# Patient Record
Sex: Male | Born: 1990 | Race: White | Hispanic: No | Marital: Single | Smoking: Never smoker
Health system: Southern US, Community
[De-identification: ages and names within clinical notes are randomized; demographics above are authoritative.]

---
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
## 2012-01-03 ENCOUNTER — Emergency Department (HOSPITAL_COMMUNITY)
Admission: EM | Admit: 2012-01-03 | Discharge: 2012-01-03 | Disposition: A | Discharge status: Home / Self Care | Payer: Self-pay | Attending: Emergency Medicine | Admitting: Emergency Medicine

## 2012-01-03 ENCOUNTER — Encounter (HOSPITAL_COMMUNITY): Payer: Self-pay | Admitting: *Deleted

## 2012-01-03 DIAGNOSIS — R1110 Vomiting, unspecified: Secondary | ICD-10-CM | POA: Insufficient documentation

## 2012-01-03 DIAGNOSIS — R109 Unspecified abdominal pain: Secondary | ICD-10-CM | POA: Insufficient documentation

## 2012-01-03 DIAGNOSIS — K529 Noninfective gastroenteritis and colitis, unspecified: Secondary | ICD-10-CM

## 2012-01-03 DIAGNOSIS — K5289 Other specified noninfective gastroenteritis and colitis: Secondary | ICD-10-CM | POA: Insufficient documentation

## 2012-01-03 DIAGNOSIS — R197 Diarrhea, unspecified: Secondary | ICD-10-CM | POA: Insufficient documentation

## 2012-01-03 DIAGNOSIS — R6883 Chills (without fever): Secondary | ICD-10-CM | POA: Insufficient documentation

## 2012-01-03 DIAGNOSIS — R51 Headache: Secondary | ICD-10-CM | POA: Insufficient documentation

## 2012-01-03 LAB — URINALYSIS, ROUTINE W REFLEX MICROSCOPIC
Bilirubin Urine: NEGATIVE
Glucose, UA: NEGATIVE mg/dL
Hgb urine dipstick: NEGATIVE
Ketones, ur: 15 mg/dL — AB
Protein, ur: NEGATIVE mg/dL

## 2012-01-03 LAB — DIFFERENTIAL
Eosinophils Relative: 0 % (ref 0–5)
Lymphocytes Relative: 4 % — ABNORMAL LOW (ref 12–46)
Lymphs Abs: 0.8 10*3/uL (ref 0.7–4.0)
Monocytes Absolute: 2.1 10*3/uL — ABNORMAL HIGH (ref 0.1–1.0)

## 2012-01-03 LAB — BASIC METABOLIC PANEL
CO2: 22 mEq/L (ref 19–32)
Calcium: 10.9 mg/dL — ABNORMAL HIGH (ref 8.4–10.5)
Creatinine, Ser: 1.11 mg/dL (ref 0.50–1.35)
Glucose, Bld: 133 mg/dL — ABNORMAL HIGH (ref 70–99)

## 2012-01-03 LAB — CBC
HCT: 49.9 % (ref 39.0–52.0)
MCH: 31.8 pg (ref 26.0–34.0)
MCV: 89.1 fL (ref 78.0–100.0)
RBC: 5.6 MIL/uL (ref 4.22–5.81)
RDW: 12 % (ref 11.5–15.5)
WBC: 22.2 10*3/uL — ABNORMAL HIGH (ref 4.0–10.5)

## 2012-01-03 MED ORDER — SODIUM CHLORIDE 0.9 % IV SOLN
INTRAVENOUS | Status: DC
  Administered 2012-01-03: 01:00:00 via INTRAVENOUS

## 2012-01-03 MED ORDER — ONDANSETRON HCL 4 MG PO TABS: 4.0000 mg | ORAL_TABLET | Freq: Four times a day (QID) | ORAL | Status: AC | PRN

## 2012-01-03 MED ORDER — SODIUM CHLORIDE 0.9 % IV BOLUS (SEPSIS)
1000.0000 mL | Freq: Once | INTRAVENOUS | Status: AC
  Administered 2012-01-03: 1000 mL via INTRAVENOUS

## 2012-01-03 MED ORDER — ONDANSETRON 8 MG PO TBDP
8.0000 mg | ORAL_TABLET | Freq: Once | ORAL | Status: AC
  Administered 2012-01-03: 8 mg via ORAL
  Filled 2012-01-03: qty 1

## 2012-01-03 MED ORDER — ONDANSETRON HCL 4 MG/2ML IJ SOLN
4.0000 mg | Freq: Once | INTRAMUSCULAR | Status: AC
  Administered 2012-01-03: 4 mg via INTRAVENOUS
  Filled 2012-01-03: qty 2

## 2012-01-03 NOTE — ED Notes (Signed)
Sprite 8 oz given for po fluid challenge.

## 2012-01-03 NOTE — ED Notes (Signed)
Pt alert & oriented x4, stable gait. Pt given discharge instructions, paperwork & prescription(s). Patient instructed to stop at the registration desk to finish any additional paperwork. pt verbalized understanding. Pt left department w/ no further questions.  

## 2012-01-03 NOTE — ED Provider Notes (Signed)
History     CSN: [id number]  Arrival date & time 01/03/12  0018   First MD Initiated Contact with Patient 01/03/12 0034      Chief Complaint  Patient presents with  . Emesis  . Diarrhea    (Consider location/radiation/quality/duration/timing/severity/associated sxs/prior treatment) HPI Comments: Patrick Key is a 21 y.o. male who has been ill for 3 days with vomiting and diarrhea. He had improved somewhat yesterday, but today it got worse again. He has a headache and Abdominal pain when he is vomiting, but at rest he is comfortable. Today, he had some chills, but did not measure his temperature. He has no known sick exposures or known contaminated food ingestion. He has not had problems similar to this before. He has not used anything to help get better. He is unable to eat or drink anything because it makes him vomit.  Patient is a 21 y.o. male presenting with vomiting and diarrhea. The history is provided by the patient and a relative.  Emesis  Associated symptoms include diarrhea.  Diarrhea The primary symptoms include vomiting and diarrhea.    History reviewed. No pertinent past medical history.  History reviewed. No pertinent past surgical history.  History reviewed. No pertinent family history.  History  Substance Use Topics  . Smoking status: Never Smoker   . Smokeless tobacco: Not on file  . Alcohol Use: No      Review of Systems  Gastrointestinal: Positive for vomiting and diarrhea.  All other systems reviewed and are negative.    Allergies  Review of patient's allergies indicates no known allergies.  Home Medications   Current Outpatient Rx  Name Route Sig Dispense Refill  . FEXOFENADINE HCL 30 MG PO TABS Oral Take 30 mg by mouth daily.    Marland Kitchen ONDANSETRON HCL 4 MG PO TABS Oral Take 1 tablet (4 mg total) by mouth 4 (four) times daily as needed for nausea. 12 tablet 0    BP 105/52  Pulse 85  Temp(Src) 98.1 F (36.7 C) (Oral)  Resp 20  Ht 5\' 9"   (1.753 m)  Wt 170 lb (77.111 kg)  BMI 25.10 kg/m2  SpO2 98%  Physical Exam  Nursing note and vitals reviewed. Constitutional: He is oriented to person, place, and time. He appears well-developed and well-nourished.  HENT:  Head: Normocephalic and atraumatic.  Right Ear: External ear normal.  Left Ear: External ear normal.  Eyes: Conjunctivae and EOM are normal. Pupils are equal, round, and reactive to light.  Neck: Normal range of motion and phonation normal. Neck supple.  Cardiovascular: Normal rate, regular rhythm, normal heart sounds and intact distal pulses.   Pulmonary/Chest: Effort normal and breath sounds normal. He exhibits no bony tenderness.  Abdominal: Soft. Normal appearance. He exhibits no mass. There is no tenderness. There is no guarding.       Hyperactive BS  Musculoskeletal: Normal range of motion.  Neurological: He is alert and oriented to person, place, and time. He has normal strength. No cranial nerve deficit or sensory deficit. He exhibits normal muscle tone. Coordination normal.  Skin: Skin is warm, dry and intact. No rash noted.  Psychiatric: He has a normal mood and affect. His behavior is normal. Judgment and thought content normal.    ED Course  Procedures (including critical care time)    Initial evaluation: Signs, and symptoms of gastroenteritis. Doubt serious bacterial illness. Treatment initiated with fluids and Zofran.  3:49 AM Reevaluation with update and discussion. After initial assessment and  treatment, an updated evaluation reveals he is tolerating oral fluids at this time, and denies abdominal pain. Repeat vital signs are reassuring.Mancel Bale L    Labs Reviewed  CBC - Abnormal; Notable for the following:    WBC 22.2 (*)    Hemoglobin 17.8 (*)    All other components within normal limits  DIFFERENTIAL - Abnormal; Notable for the following:    Neutrophils Relative 87 (*)    Neutro Abs 19.2 (*)    Lymphocytes Relative 4 (*)     Monocytes Absolute 2.1 (*)    All other components within normal limits  BASIC METABOLIC PANEL - Abnormal; Notable for the following:    Glucose, Bld 133 (*)    Calcium 10.9 (*)    All other components within normal limits  URINALYSIS, ROUTINE W REFLEX MICROSCOPIC - Abnormal; Notable for the following:    Ketones, ur 15 (*)    All other components within normal limits   No results found.   1. Gastroenteritis       MDM  The evaluation is consistent with gastroenteritis, likely viral. I doubt serious bacterial infection, colitis, urinary tract infection or metabolic instability. Patient has improved, with symptomatic treatment. He is a candidate for outpatient treatment, expectantly with symptomatic care.  Plan: Home Medications- Zofran; Home Treatments- Clear liquids and gradual diet advance; Recommended follow up- prn        Flint Melter, MD 01/03/12 [phone]

## 2012-01-03 NOTE — ED Notes (Addendum)
Pt reports started vomiting on sat. Also complaint of diarrhea. Activly vomiting in triage.

## 2012-01-03 NOTE — ED Notes (Signed)
C/o n/v/d x 4 days; denies abd pain.

## 2012-01-03 NOTE — Discharge Instructions (Signed)
Use a clear liquid diet for one to 2 days, then a bland diet. Return here if needed.  RESOURCE GUIDE  Dental Problems  Patients with Medicaid: [facility] [phone]                                           [phone] [address] Phone:  [phone]                                                  Phone:  [phone]  If unable to pay or uninsured, contact:  [facility] or [facility]. to become qualified for the adult dental clinic.  Chronic Pain Problems Contact [facility]  [phone] Patients need to be referred by their primary care doctor.  Insufficient Money for Medicine Contact [employer] Way:  call "211" or [facility] (332) 774-8598.  No Primary Care Doctor Call [facility]  [phone] Other agencies that provide inexpensive medical care    [facility]  [phone]    [facility]  [phone]    [facility]  [phone]    [facility]  [phone]    [facility]  [phone]    [facility]  [phone]  Psychological Services [facility]  [phone] [facility]  [phone] [facility]   [phone] (emergency services [phone])  Substance Abuse Resources [facility]  9082507683 [facility] [phone] [facility] [phone] Floyd[facility] Flock [phone] Residential & Outpatient Substance Abuse Program  ([phone]  Abuse/Neglect [facility] Child Abuse Hotline (731)594-5713 [facility] Child Abuse Hotline 367-667-3346 (After Hours)  Emergency Shelter [facility] [phone]  M[facility] [place] [facility] (214)036-1714 [facility] (431)650-1606  MRSA Hotline #:   406 077 5312    [facility] Resources  [facility]     [employer]                          [facility] [address] [place]                       [address]      [address]  [place]                                                Cristobal Goldmann Phone:  [phone]                                   Phone:  [phone]  Phone:  [phone]  [facility] Phone:  [phone]  [facility] Child Abuse Hotline 431-687-4397 3348406472 (After Hours) B.R.A.T. Diet Your doctor has recommended the B.R.A.T. diet for you or your child until the condition improves. This is often used to help control diarrhea and vomiting symptoms. If you or your child can tolerate clear liquids, you may have:  Bananas.   Rice.   Applesauce.   Toast (and other simple starches such as crackers, potatoes, noodles).  Be sure to avoid dairy products, meats, and fatty foods until symptoms are better. Fruit juices such as apple, grape, and prune juice can make diarrhea worse. Avoid these. Continue this diet for 2 days or as instructed by your caregiver. Document Released: 10/16/2005 Document Revised: 10/05/2011 Document Reviewed: 04/04/2007 [facility] Patient Information 2012 [place], [place].Clear Liquid Diet The clear liquid dietconsists of foods that are liquid or will become liquid at room temperature.You should be able to see through the liquid and beverages. Examples of foods allowed on a clear liquid diet include fruit juice, broth or bouillon, gelatin, or frozen ice pops. The purpose of this diet is to provide necessary fluid, electrolytes such as sodium and potassium, and energy to keep the body functioning during times when you are not able to consume a regular diet.A clear liquid diet should not be continued for long periods of time as it is not nutritionally adequate.  REASONS FOR USING A CLEAR LIQUID DIET  In sudden onset (acute) conditions for a patient before or after surgery.   As the first step in oral  feeding.   For fluid and electrolyte replacement in diarrheal diseases.   As a diet before certain medical tests are performed.  ADEQUACY The clear liquid diet is adequate only in ascorbic acid, according to the Recommended Dietary Allowances of the [employer]. CHOOSING FOODS Breads and Starches  Allowed:  None are allowed.   Avoid: All are avoided.  Vegetables  Allowed:  Strained tomato or vegetable juice.   Avoid: Any others.  Fruit  Allowed:  Strained fruit juices and fruit drinks. Include 1 serving of citrus or vitamin C-enriched fruit juice daily.   Avoid: Any others.  Meat and Meat Substitutes  Allowed:  None are allowed.   Avoid: All are avoided.  Milk  Allowed:  None are allowed.   Avoid: All are avoided.  Soups and Combination Foods  Allowed:  Clear bouillon, broth, or strained broth-based soups.   Avoid: Any others.  Desserts and Sweets  Allowed:  Sugar, honey. High protein gelatin. Flavored gelatin, ices, or frozen ice pops that do not contain milk.   Avoid: Any others.  Fats and Oils  Allowed:  None are allowed.   Avoid: All are avoided.  Beverages  Allowed: Cereal beverages, coffee (regular or decaffeinated), tea, or soda at the discretion of your caregiver.   Avoid: Any others.  Condiments  Allowed:  Iodized salt.   Avoid: Any others, including pepper.  Supplements  Allowed:  Liquid nutrition beverages.   Avoid: Any others that contain lactose or fiber.  SAMPLE MEAL PLAN Breakfast  4 oz (120 mL) strained orange juice.    to 1 cup (125 to 250 mL) gelatin (plain or fortified).   1 cup (250 mL) beverage (coffee or tea).   Sugar, if desired.  Midmorning Snack   cup (125 mL) gelatin (plain or fortified).  Lunch  1 cup (250 mL) broth or consomm.   4 oz (120 mL) strained grapefruit  juice.    cup (125 mL) gelatin (plain or fortified).   1 cup (250 mL) beverage (coffee or tea).   Sugar, if desired.    Midafternoon Snack   cup (125 mL) fruit ice.    cup (125 mL) strained fruit juice.  Dinner  1 cup (250 mL) broth or consomm.    cup (125 mL) cranberry juice.    cup (125 mL) flavored gelatin (plain or fortified).   1 cup (250 mL) beverage (coffee or tea).   Sugar, if desired.  Evening Snack  4 oz (120 mL) strained apple juice (vitamin C-fortified).    cup (125 mL) flavored gelatin (plain or fortified).  Document Released: 10/16/2005 Document Revised: 10/05/2011 Document Reviewed: 01/13/2011 [facility] Patient Information 2012 [place], [place].Viral Gastroenteritis Viral gastroenteritis is also called stomach flu. This illness is caused by a certain type of germ (virus). It can cause sudden watery poop (diarrhea) and throwing up (vomiting). This can cause you to lose body fluids (dehydration). This illness usually lasts for 3 to 8 days. It usually goes away on its own. HOME CARE   Drink enough fluids to keep your pee (urine) clear or pale yellow. Drink small amounts of fluids often.   Ask your doctor how to replace body fluid losses (rehydration).   Avoid:   Foods high in sugar.   Alcohol.   Bubbly (carbonated) drinks.   Tobacco.   Juice.   Caffeine drinks.   Very hot or cold fluids.   Fatty, greasy foods.   Eating too much at one time.   Dairy products until 24 to 48 hours after your watery poop stops.   You may eat foods with active cultures (probiotics). They can be found in some yogurts and supplements.   Wash your hands well to avoid spreading the illness.   Only take medicines as told by your doctor. Do not give aspirin to children. Do not take medicines for watery poop (antidiarrheals).   Ask your doctor if you should keep taking your regular medicines.   Keep all doctor visits as told.  GET HELP RIGHT AWAY IF:   You cannot keep fluids down.   You do not pee at least once every 6 to 8 hours.   You are short of breath.   You see blood in  your poop or throw up. This may look like coffee grounds.   You have belly (abdominal) pain that gets worse or is just in one small spot (localized).   You keep throwing up or having watery poop.   You have a fever.   The patient is a child younger than 3 months, and he or she has a fever.   The patient is a child older than 3 months, and he or she has a fever and problems that do not go away.   The patient is a child older than 3 months, and he or she has a fever and problems that suddenly get worse.   The patient is a baby, and he or she has no tears when crying.  MAKE SURE YOU:   Understand these instructions.   Will watch your condition.   Will get help right away if you are not doing well or get worse.  Document Released: 04/03/2008 Document Revised: 10/05/2011 Document Reviewed: 08/02/2011 [facility] Patient Information 2012 [place], [place].

## 2012-09-15 ENCOUNTER — Emergency Department (HOSPITAL_COMMUNITY): Payer: No Typology Code available for payment source

## 2012-09-15 ENCOUNTER — Encounter (HOSPITAL_COMMUNITY): Payer: Self-pay

## 2012-09-15 ENCOUNTER — Emergency Department (HOSPITAL_COMMUNITY)
Admission: EM | Admit: 2012-09-15 | Discharge: 2012-09-15 | Disposition: A | Discharge status: Home / Self Care | Payer: No Typology Code available for payment source | Attending: Emergency Medicine | Admitting: Emergency Medicine

## 2012-09-15 DIAGNOSIS — S01409A Unspecified open wound of unspecified cheek and temporomandibular area, initial encounter: Secondary | ICD-10-CM | POA: Insufficient documentation

## 2012-09-15 DIAGNOSIS — Y939 Activity, unspecified: Secondary | ICD-10-CM | POA: Insufficient documentation

## 2012-09-15 DIAGNOSIS — S43006A Unspecified dislocation of unspecified shoulder joint, initial encounter: Secondary | ICD-10-CM | POA: Insufficient documentation

## 2012-09-15 DIAGNOSIS — Y92410 Unspecified street and highway as the place of occurrence of the external cause: Secondary | ICD-10-CM | POA: Insufficient documentation

## 2012-09-15 DIAGNOSIS — S0181XA Laceration without foreign body of other part of head, initial encounter: Secondary | ICD-10-CM

## 2012-09-15 DIAGNOSIS — Z23 Encounter for immunization: Secondary | ICD-10-CM | POA: Insufficient documentation

## 2012-09-15 MED ORDER — HYDROCODONE-ACETAMINOPHEN 5-325 MG PO TABS: 1.0000 | ORAL_TABLET | Freq: Four times a day (QID) | ORAL | Status: DC | PRN

## 2012-09-15 MED ORDER — LIDOCAINE HCL (PF) 2 % IJ SOLN
INTRAMUSCULAR | Status: AC
  Administered 2012-09-15: 22:00:00
  Filled 2012-09-15: qty 10

## 2012-09-15 MED ORDER — TETANUS-DIPHTH-ACELL PERTUSSIS 5-2.5-18.5 LF-MCG/0.5 IM SUSP
0.5000 mL | Freq: Once | INTRAMUSCULAR | Status: AC
  Administered 2012-09-15: 0.5 mL via INTRAMUSCULAR
  Filled 2012-09-15: qty 0.5

## 2012-09-15 MED ORDER — LIDOCAINE HCL (PF) 2 % IJ SOLN
INTRAMUSCULAR | Status: AC
  Administered 2012-09-15: 21:00:00
  Filled 2012-09-15: qty 10

## 2012-09-15 MED ORDER — ONDANSETRON HCL 4 MG/2ML IJ SOLN
4.0000 mg | Freq: Once | INTRAMUSCULAR | Status: AC
  Administered 2012-09-15: 4 mg via INTRAVENOUS
  Filled 2012-09-15: qty 2

## 2012-09-15 MED ORDER — HYDROMORPHONE HCL PF 1 MG/ML IJ SOLN
1.0000 mg | Freq: Once | INTRAMUSCULAR | Status: AC
  Administered 2012-09-15: 1 mg via INTRAVENOUS
  Filled 2012-09-15: qty 1

## 2012-09-15 NOTE — ED Provider Notes (Signed)
Medical screening examination/treatment/procedure(s) were conducted as a shared visit with non-physician practitioner(s) and myself.  I personally evaluated the patient during the encounter   Benny Lennert, MD 09/15/12 2318
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
## 2012-09-15 NOTE — ED Notes (Signed)
Patients mother to desk stating that patient was feeling like he was going to throw up. Went to patients room, and gave patient an emesis bag. Patient inquiring about how long it would be before he is seen by a physician. Informed patient that he is on the tracker to be seen and a physician should be in to see him shortly.

## 2012-09-15 NOTE — ED Notes (Signed)
Pt alert & oriented x4, stable gait. Patient given discharge instructions, paperwork & prescription(s). Patient  instructed to stop at the registration desk to finish any additional paperwork. Patient verbalized understanding. Pt left department w/ no further questions. 

## 2012-09-15 NOTE — ED Provider Notes (Signed)
LACERATION REPAIR Performed by: Burgess Amor Authorized by: Burgess Amor Consent: Verbal consent obtained. Risks and benefits: risks, benefits and alternatives were discussed Consent given by: patient Patient identity confirmed: provided demographic data Prepped and Draped in normal sterile fashion Wound explored  Laceration Location: right cheek  Laceration Length: 8 cm  No Foreign Bodies seen or palpated  Anesthesia: local infiltration  Local anesthetic: lidocaine 2% without epinephrine  Anesthetic total: 10 ml  Irrigation method: syringe Amount of cleaning: standard  Skin closure: ethilon 5-0  Number of sutures: 2 sub cutaneous sutures with 6-0 vicryl rapide.  #24 surface sutures with 5-0 ethilon  Technique: simple interrupted.complicated flap closure.  Patient tolerance: Patient tolerated the procedure well with no immediate complications.      Burgess Amor, [place] 09/15/12 2303
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
## 2021-02-20 ENCOUNTER — Encounter (HOSPITAL_COMMUNITY): Payer: Self-pay | Admitting: Emergency Medicine

## 2021-02-20 ENCOUNTER — Other Ambulatory Visit: Payer: Self-pay

## 2021-02-20 ENCOUNTER — Emergency Department (HOSPITAL_COMMUNITY)
Admission: EM | Admit: 2021-02-20 | Discharge: 2021-02-21 | Disposition: A | Discharge status: Home / Self Care | Payer: Self-pay | Attending: Emergency Medicine | Admitting: Emergency Medicine

## 2021-02-20 DIAGNOSIS — R197 Diarrhea, unspecified: Secondary | ICD-10-CM | POA: Insufficient documentation

## 2021-02-20 DIAGNOSIS — R112 Nausea with vomiting, unspecified: Secondary | ICD-10-CM | POA: Insufficient documentation

## 2021-02-20 DIAGNOSIS — R000 Tachycardia, unspecified: Secondary | ICD-10-CM | POA: Insufficient documentation

## 2021-02-20 MED ORDER — ONDANSETRON 4 MG PO TBDP: 4.0000 mg | ORAL_TABLET | Freq: Once | ORAL | Status: DC

## 2021-02-20 MED ORDER — LACTATED RINGERS IV BOLUS
1000.0000 mL | Freq: Once | INTRAVENOUS | Status: AC
  Administered 2021-02-21: 1000 mL via INTRAVENOUS

## 2021-02-20 MED ORDER — ONDANSETRON HCL 4 MG/2ML IJ SOLN
4.0000 mg | Freq: Once | INTRAMUSCULAR | Status: AC
  Administered 2021-02-21: 4 mg via INTRAVENOUS
  Filled 2021-02-20: qty 2

## 2021-02-20 NOTE — ED Provider Notes (Signed)
[facility] EMERGENCY DEPARTMENT Provider Note  CSN: [id number] Arrival date & time: 02/20/21 2228    History Chief Complaint  Patient presents with  . Emesis    HPI  Patrick Key is a 30 y.o. male with no significant PMH reports onset of nausea, vomiting and diarrhea round 1400hrs today. Multiple episodes of both, initially emesis was stomach contents but now is just yellow. Neither has been bloody. No abdominal pain. Was febrile at home but not here. Denies significant alcohol use. No prior history of same. Feels weak.    History reviewed. No pertinent past medical history.  History reviewed. No pertinent surgical history.  History reviewed. No pertinent family history.  Social History   Tobacco Use  . Smoking status: Never Smoker  . Smokeless tobacco: Never Used  Substance Use Topics  . Alcohol use: No  . Drug use: No     Home Medications Prior to Admission medications   Medication Sig Start Date End Date Taking? Authorizing Provider  ondansetron (ZOFRAN ODT) 4 MG disintegrating tablet Take 1 tablet (4 mg total) by mouth every 8 (eight) hours as needed for nausea or vomiting. 02/21/21  Yes Pollyann Savoy, MD     Allergies    Patient has no known allergies.   Review of Systems   Review of Systems A comprehensive review of systems was completed and negative except as noted in HPI.    Physical Exam BP 139/74   Pulse 79   Temp 98.7 F (37.1 C) (Oral)   Resp 16   Ht 5\' 9"  (1.753 m)   Wt 93 kg   SpO2 97%   BMI 30.27 kg/m   Physical Exam Vitals and nursing note reviewed.  Constitutional:      Appearance: Normal appearance.  HENT:     Head: Normocephalic and atraumatic.     Nose: Nose normal.     Mouth/Throat:     Mouth: Mucous membranes are dry.  Eyes:     Extraocular Movements: Extraocular movements intact.     Conjunctiva/sclera: Conjunctivae normal.  Cardiovascular:     Rate and Rhythm: Tachycardia present.  Pulmonary:     Effort:  Pulmonary effort is normal.     Breath sounds: Normal breath sounds.  Abdominal:     General: Abdomen is flat. There is no distension.     Palpations: Abdomen is soft.     Tenderness: There is no abdominal tenderness. There is no guarding.  Musculoskeletal:        General: No swelling. Normal range of motion.     Cervical back: Neck supple.  Skin:    General: Skin is warm and dry.  Neurological:     General: No focal deficit present.     Mental Status: He is alert.  Psychiatric:        Mood and Affect: Mood normal.      ED Results / Procedures / Treatments   Labs (all labs ordered are listed, but only abnormal results are displayed) Labs Reviewed  COMPREHENSIVE METABOLIC PANEL - Abnormal; Notable for the following components:      Result Value   CO2 21 (*)    Glucose, Bld 118 (*)    BUN 23 (*)    All other components within normal limits  CBC WITH DIFFERENTIAL/PLATELET - Abnormal; Notable for the following components:   WBC 15.9 (*)    Neutro Abs 14.3 (*)    Lymphs Abs 0.6 (*)    All other components  within normal limits  URINALYSIS, ROUTINE W REFLEX MICROSCOPIC - Abnormal; Notable for the following components:   Ketones, ur 5 (*)    All other components within normal limits    EKG None  Radiology No results found.  Procedures Procedures  Medications Ordered in the ED Medications  ondansetron (ZOFRAN) injection 4 mg (4 mg Intravenous Given 02/21/21 0012)  lactated ringers bolus 1,000 mL (0 mLs Intravenous Stopped 02/21/21 0257)  lactated ringers bolus 1,000 mL (1,000 mLs Intravenous New Bag/Given 02/21/21 0236)  ondansetron (ZOFRAN) injection 4 mg (4 mg Intravenous Given 02/21/21 0236)     MDM Rules/Calculators/A&P MDM Will check labs, give IVF, Zofran and reassess.   ED Course  I have reviewed the triage vital signs and the nursing notes.  Pertinent labs & imaging results that were available during my care of the patient were reviewed by me and considered  in my medical decision making (see chart for details).  Clinical Course as of 02/21/21 0423  Mon Feb 21, 2021  0040 CBC with elevated WBC, but otherwise normal.  [CS]  0044 CMP is unremarkable.  [CS]  0157 Patient still feeling nauseated and weak although improved some. Will give additional Zofran, IVF and reassess.  [CS]  0422 Patient feeling better, tolerating PO and ready to go home. [CS]    Clinical Course User Index [CS] Pollyann Savoy, MD    Final Clinical Impression(s) / ED Diagnoses Final diagnoses:  Nausea vomiting and diarrhea    Rx / DC Orders ED Discharge Orders         Ordered    ondansetron (ZOFRAN ODT) 4 MG disintegrating tablet  Every 8 hours PRN        02/21/21 0423           Pollyann Savoy, MD 02/21/21 [phone]

## 2021-02-20 NOTE — ED Triage Notes (Signed)
Pt with c/o vomiting and diarrhea since 1430. Pt states he thinks he and his wife have food poisoning after eating "same thing" but states she is not as bad as him.

## 2021-02-21 LAB — COMPREHENSIVE METABOLIC PANEL
ALT: 31 U/L (ref 0–44)
AST: 28 U/L (ref 15–41)
Albumin: 5 g/dL (ref 3.5–5.0)
Alkaline Phosphatase: 72 U/L (ref 38–126)
Anion gap: 10 (ref 5–15)
BUN: 23 mg/dL — ABNORMAL HIGH (ref 6–20)
CO2: 21 mmol/L — ABNORMAL LOW (ref 22–32)
Calcium: 10 mg/dL (ref 8.9–10.3)
Chloride: 107 mmol/L (ref 98–111)
Creatinine, Ser: 0.94 mg/dL (ref 0.61–1.24)
GFR, Estimated: >60 mL/min (ref >60)
Glucose, Bld: 118 mg/dL — ABNORMAL HIGH (ref 70–99)
Potassium: 3.7 mmol/L (ref 3.5–5.1)
Sodium: 138 mmol/L (ref 135–145)
Total Bilirubin: 1 mg/dL (ref 0.3–1.2)
Total Protein: 8.1 g/dL (ref 6.5–8.1)

## 2021-02-21 LAB — CBC WITH DIFFERENTIAL/PLATELET
Abs Immature Granulocytes: 0.05 10*3/uL (ref 0.00–0.07)
Basophils Absolute: 0.1 10*3/uL (ref 0.0–0.1)
Basophils Relative: 0 %
Eosinophils Absolute: 0 10*3/uL (ref 0.0–0.5)
Eosinophils Relative: 0 %
HCT: 49 % (ref 39.0–52.0)
Hemoglobin: 17 g/dL (ref 13.0–17.0)
Immature Granulocytes: 0 %
Lymphocytes Relative: 4 %
Lymphs Abs: 0.6 10*3/uL — ABNORMAL LOW (ref 0.7–4.0)
MCH: 30.8 pg (ref 26.0–34.0)
MCHC: 34.7 g/dL (ref 30.0–36.0)
MCV: 88.8 fL (ref 80.0–100.0)
Monocytes Absolute: 1 10*3/uL (ref 0.1–1.0)
Monocytes Relative: 6 %
Neutro Abs: 14.3 10*3/uL — ABNORMAL HIGH (ref 1.7–7.7)
Neutrophils Relative %: 90 %
Platelets: 176 10*3/uL (ref 150–400)
RBC: 5.52 MIL/uL (ref 4.22–5.81)
RDW: 11.8 % (ref 11.5–15.5)
WBC: 15.9 10*3/uL — ABNORMAL HIGH (ref 4.0–10.5)
nRBC: 0 % (ref 0.0–0.2)

## 2021-02-21 LAB — URINALYSIS, ROUTINE W REFLEX MICROSCOPIC
Bilirubin Urine: NEGATIVE
Glucose, UA: NEGATIVE mg/dL
Hgb urine dipstick: NEGATIVE
Ketones, ur: 5 mg/dL — AB
Leukocytes,Ua: NEGATIVE
Nitrite: NEGATIVE
Protein, ur: NEGATIVE mg/dL
Specific Gravity, Urine: 1.029 (ref 1.005–1.030)
pH: 6 (ref 5.0–8.0)

## 2021-02-21 MED ORDER — ONDANSETRON 4 MG PO TBDP: 4.0000 mg | ORAL_TABLET | Freq: Three times a day (TID) | ORAL | 0 refills | Status: AC | PRN

## 2021-02-21 MED ORDER — LACTATED RINGERS IV BOLUS
1000.0000 mL | Freq: Once | INTRAVENOUS | Status: AC
  Administered 2021-02-21: 1000 mL via INTRAVENOUS

## 2021-02-21 MED ORDER — ONDANSETRON HCL 4 MG/2ML IJ SOLN
4.0000 mg | Freq: Once | INTRAMUSCULAR | Status: AC
  Administered 2021-02-21: 4 mg via INTRAVENOUS
  Filled 2021-02-21: qty 2

## 2021-02-21 NOTE — ED Notes (Signed)
Gave pt water 

## 2021-03-26 ENCOUNTER — Encounter (HOSPITAL_COMMUNITY): Payer: Self-pay | Admitting: *Deleted

## 2021-03-26 ENCOUNTER — Emergency Department (HOSPITAL_COMMUNITY)
Admission: EM | Admit: 2021-03-26 | Discharge: 2021-03-26 | Disposition: A | Discharge status: Home / Self Care | Payer: Medicaid Other | Attending: Emergency Medicine | Admitting: Emergency Medicine

## 2021-03-26 ENCOUNTER — Other Ambulatory Visit: Payer: Self-pay

## 2021-03-26 DIAGNOSIS — R197 Diarrhea, unspecified: Secondary | ICD-10-CM | POA: Insufficient documentation

## 2021-03-26 DIAGNOSIS — Z5321 Procedure and treatment not carried out due to patient leaving prior to being seen by health care provider: Secondary | ICD-10-CM | POA: Insufficient documentation

## 2021-03-26 DIAGNOSIS — R112 Nausea with vomiting, unspecified: Secondary | ICD-10-CM | POA: Insufficient documentation

## 2021-03-26 NOTE — ED Provider Notes (Signed)
Pt complains of nausea vomiting and diarrhea. Pt drank juice that tasted bad.  PE vitals stable Lungs normal rate heart rate normal   MSE was initiated and I personally evaluated the patient and placed orders (if any) at  3:33 PM on Mar 26, 2021.  The patient appears stable so that the remainder of the MSE may be completed by another provider.   Osie Cheeks 03/26/21 1539    Eber Hong, MD 04/01/21 [phone]

## 2021-03-26 NOTE — ED Notes (Signed)
MSE done in triage

## 2021-03-26 NOTE — ED Triage Notes (Signed)
Pt with emesis and diarrhea since early this morning.  Pt states he drank some juice that the taste was off, pt believes the juice is what made him sick.

## 2023-07-25 ENCOUNTER — Emergency Department (HOSPITAL_COMMUNITY)
Admission: EM | Admit: 2023-07-25 | Discharge: 2023-07-25 | Disposition: A | Discharge status: Home / Self Care | Payer: Worker's Compensation | Attending: Emergency Medicine | Admitting: Emergency Medicine

## 2023-07-25 ENCOUNTER — Other Ambulatory Visit: Payer: Self-pay

## 2023-07-25 ENCOUNTER — Encounter (HOSPITAL_COMMUNITY): Payer: Self-pay | Admitting: Emergency Medicine

## 2023-07-25 ENCOUNTER — Emergency Department (HOSPITAL_COMMUNITY): Payer: Worker's Compensation

## 2023-07-25 DIAGNOSIS — M25562 Pain in left knee: Secondary | ICD-10-CM | POA: Diagnosis present

## 2023-07-25 DIAGNOSIS — Y990 Civilian activity done for income or pay: Secondary | ICD-10-CM | POA: Diagnosis not present

## 2023-07-25 DIAGNOSIS — X501XXA Overexertion from prolonged static or awkward postures, initial encounter: Secondary | ICD-10-CM | POA: Diagnosis not present

## 2023-07-25 MED ORDER — NAPROXEN 500 MG PO TABS: 500.0000 mg | ORAL_TABLET | Freq: Two times a day (BID) | ORAL | 0 refills | Status: AC

## 2023-07-25 MED ORDER — IBUPROFEN 400 MG PO TABS
600.0000 mg | ORAL_TABLET | Freq: Once | ORAL | Status: AC
  Administered 2023-07-25: 600 mg via ORAL
  Filled 2023-07-25: qty 2

## 2023-07-25 NOTE — ED Triage Notes (Signed)
Pt was kneeling down at work today and felt a pop in left knee. Pt states felt like something was out of place and then popped it back into place. Pt using crutches at this time and unable to apply weight to left leg.

## 2023-07-25 NOTE — Discharge Instructions (Signed)
Seen in the ER for left knee pain after feeling a popping sensation today.  Your x-rays are normal.  You likely have a sprain.  Use the naproxen to help with the pain, you can use ice and use the knee immobilizer and crutches as needed.  Follow-up with either primary care and/or orthopedics.  Come back to the ER if you have new or worsening symptoms.  [facility] Primary Care Doctor List    Syliva Overman, MD. Specialty: [facility] Medicine Contact information: [address]  [place] [address]  [phone]   Lilyan Punt, MD. Specialty: [facility] Medicine Contact information: [address]  [place] [address]  [phone]   Avon Gully, MD Specialty: Internal Medicine Contact information: [address] [place] [address]  [phone]   Catalina Pizza, MD. Specialty: Internal Medicine Contact information: [address]  [place] [address]  [phone]    [facility] (Dr. Selena Batten) Specialty: Family Medicine Contact information: [address]  [place] [address]  [phone]   John Giovanni, MD. Specialty: [facility] Medicine Contact information: [address]  [address]  [place] [address]  [phone]   Carylon Perches, MD. Specialty: Internal Medicine Contact information: [address]  [address]  [place] [address]  [phone]    [facility]  [address] [place], [place] [address] [phone]  Services [facility] offers a variety of basic health services.  Services include but are not limited to: Blood pressure checks  Heart rate checks  Blood sugar checks  Urine analysis  Rapid strep tests  Pregnancy tests.  Health education and referrals  People needing more complex services will be directed to a physician online. Using these virtual visits, doctors can evaluate and prescribe medicine and treatments. There will be no  medication on-site, though [facility] will help patients fill their prescriptions at little to no cost.   For More information please go to: [email or web address]

## 2023-07-26 ENCOUNTER — Other Ambulatory Visit: Payer: Self-pay

## 2023-07-26 ENCOUNTER — Encounter (HOSPITAL_COMMUNITY): Payer: Self-pay | Admitting: Emergency Medicine

## 2023-07-26 ENCOUNTER — Emergency Department (HOSPITAL_COMMUNITY)
Admission: EM | Admit: 2023-07-26 | Discharge: 2023-07-26 | Disposition: A | Discharge status: Home / Self Care | Payer: Worker's Compensation | Attending: Emergency Medicine | Admitting: Emergency Medicine

## 2023-07-26 DIAGNOSIS — M25562 Pain in left knee: Secondary | ICD-10-CM | POA: Diagnosis present

## 2023-07-26 MED ORDER — HYDROCODONE-ACETAMINOPHEN 5-325 MG PO TABS
1.0000 | ORAL_TABLET | Freq: Once | ORAL | Status: AC
  Administered 2023-07-26: 1 via ORAL
  Filled 2023-07-26: qty 1

## 2023-07-26 MED ORDER — LIDOCAINE 5 % EX PTCH: 1.0000 | MEDICATED_PATCH | CUTANEOUS | 0 refills | Status: AC

## 2023-07-26 NOTE — ED Triage Notes (Signed)
Pt presents with left knee pain, seen at this ED for same on yesterday, given brace and crutches, f/u with Ortho is Wed, pt unable to put weight on leg on go to work, pain is worse.

## 2023-07-26 NOTE — Discharge Instructions (Signed)
You were seen in the ER today for knee pain after an injury yesterday.  Continue taking the naproxen you are prescribed last night.  You can use lidocaine patches topically as well.  Use the crutches and follow-up with orthopedics as scheduled next week.  Come back to the ER for new or worsening symptoms

## 2023-07-26 NOTE — ED Provider Notes (Signed)
[place] EMERGENCY DEPARTMENT AT [facility] Provider Note   CSN: [id number] Arrival date & time: 07/26/23  1213     History  Chief Complaint  Patient presents with   Knee Injury    Left    Patrick Key is a 32 y.o. male.  He denies any significant PMH.  Presents to ER for continued left knee pain.  Yesterday around 10 AM he twisted the knee getting to the back of an 18 wheeler for work and felt a pop medially and has been having pain since then.  Seen in the ER last night and had x-rays that were negative for any fracture or malalignment.  He states he is having a lot of pain today, not able to bear weight, though he was given the immobilizer and crutches.  He has scheduled orthopedic follow-up for Wednesday.  He denies numbness or tingling, no color change to his foot.  Did notice some bruising just distal to the knee medially today  HPI     Home Medications Prior to Admission medications   Medication Sig Start Date End Date Taking? Authorizing Provider  lidocaine (LIDODERM) 5 % Place 1 patch onto the skin daily. Remove & Discard patch within 12 hours or as directed by MD 07/26/23  Yes Cristi Loron, Lourene Hoston A, PA-C  naproxen (NAPROSYN) 500 MG tablet Take 1 tablet (500 mg total) by mouth 2 (two) times daily. 07/25/23   Carmel Sacramento A, PA-C  ondansetron (ZOFRAN ODT) 4 MG disintegrating tablet Take 1 tablet (4 mg total) by mouth every 8 (eight) hours as needed for nausea or vomiting. 02/21/21   Pollyann Savoy, MD      Allergies    Patient has no known allergies.    Review of Systems   Review of Systems  Physical Exam Updated Vital Signs BP 136/83   Pulse (!) 48   Temp 98.4 F (36.9 C) (Oral)   Resp 18   Ht 5\' 9"  (1.753 m)   Wt 93 kg   SpO2 99%   BMI 30.28 kg/m  Physical Exam Vitals and nursing note reviewed.  Constitutional:      General: He is not in acute distress.    Appearance: He is well-developed.  HENT:     Head: Normocephalic and atraumatic.      Mouth/Throat:     Mouth: Mucous membranes are moist.  Eyes:     Conjunctiva/sclera: Conjunctivae normal.  Cardiovascular:     Rate and Rhythm: Normal rate and regular rhythm.     Heart sounds: No murmur heard. Pulmonary:     Effort: Pulmonary effort is normal. No respiratory distress.     Breath sounds: Normal breath sounds.  Abdominal:     Palpations: Abdomen is soft.     Tenderness: There is no abdominal tenderness.  Musculoskeletal:        General: No swelling.     Cervical back: Neck supple.     Comments: To medial aspect of left knee, DP and PT pulses as well as popliteal pulses are intact on exam.  No calf tenderness.  There is small amount of ecchymosis over the area just distal to the knee on the medial aspect.  No redness.  No joint effusion.  Flexion limited due to pain not actively but passive range of motion intact.  Skin:    General: Skin is warm and dry.     Capillary Refill: Capillary refill takes less than 2 seconds.  Neurological:  General: No focal deficit present.     Mental Status: He is alert and oriented to person, place, and time.  Psychiatric:        Mood and Affect: Mood normal.     ED Results / Procedures / Treatments   Labs (all labs ordered are listed, but only abnormal results are displayed) Labs Reviewed - No data to display  EKG None  Radiology DG Knee Complete 4 Views Left  Result Date: 07/25/2023 CLINICAL DATA:  Status post trauma. EXAM: LEFT KNEE - COMPLETE 4+ VIEW COMPARISON:  None Available. FINDINGS: No evidence of fracture, dislocation, or joint effusion. No evidence of arthropathy or other focal bone abnormality. Soft tissues are unremarkable. IMPRESSION: Negative. Electronically Signed   By: Aram Candela M.D.   On: 07/25/2023 20:03    Procedures Procedures    Medications Ordered in ED Medications  HYDROcodone-acetaminophen (NORCO/VICODIN) 5-325 MG per tablet 1 tablet (has no administration in time range)    ED  Course/ Medical Decision Making/ A&P                                 Medical Decision Making Risk Prescription drug management.   DDx: Fracture, sprain, dislocation, other  ED Course: Patient here for continued knee pain, evaluated last night had x-rays were negative.  Denies any new symptoms but was wondering about additional imaging.  Discussed no indication for emergent MRI or CT at this time.  His leg is neurovascularly intact.  He already has Ortho follow-up scheduled.  Advised on follow-up and return precautions.  He was given 1 dose of Norco in the ER, will send home with  Lidoderm patches and instructions to continue taking the naproxen that was prescribed last night.        Final Clinical Impression(s) / ED Diagnoses Final diagnoses:  Acute pain of left knee    Rx / DC Orders ED Discharge Orders          Ordered    lidocaine (LIDODERM) 5 %  Every 24 hours        07/26/23 [address] PA-C 07/26/23 1325    Derwood Kaplan, MD 07/27/23 1536

## 2023-07-26 NOTE — ED Provider Notes (Signed)
[place] EMERGENCY DEPARTMENT AT [facility] Provider Note   CSN: [id number] Arrival date & time: 07/25/23  1837     History  Chief Complaint  Patient presents with   Knee Injury    Patrick Key is a 32 y.o. male.  No significant PMH.  Presents for left knee pain medial aspect after feeling a pop when stepping up into an 18 wheeler today at work.  No numbness or tingling, able to bear weight and ambulate but using crutches to help take some of the pressure off his knee.  No swelling.  HPI     Home Medications Prior to Admission medications   Medication Sig Start Date End Date Taking? Authorizing Provider  naproxen (NAPROSYN) 500 MG tablet Take 1 tablet (500 mg total) by mouth 2 (two) times daily. 07/25/23  Yes Basem Yannuzzi A, PA-C  ondansetron (ZOFRAN ODT) 4 MG disintegrating tablet Take 1 tablet (4 mg total) by mouth every 8 (eight) hours as needed for nausea or vomiting. 02/21/21   Pollyann Savoy, MD      Allergies    Patient has no known allergies.    Review of Systems   Review of Systems  Physical Exam Updated Vital Signs BP 123/71 (BP Location: Right Arm)   Pulse (!) 50   Temp 99 F (37.2 C)   Resp 17   Ht 5\' 9"  (1.753 m)   Wt 93 kg   SpO2 94%   BMI 30.27 kg/m  Physical Exam Vitals and nursing note reviewed.  Constitutional:      General: He is not in acute distress.    Appearance: He is well-developed.  HENT:     Head: Normocephalic and atraumatic.  Eyes:     Conjunctiva/sclera: Conjunctivae normal.  Cardiovascular:     Rate and Rhythm: Normal rate and regular rhythm.     Heart sounds: No murmur heard. Pulmonary:     Effort: Pulmonary effort is normal. No respiratory distress.     Breath sounds: Normal breath sounds.  Abdominal:     Palpations: Abdomen is soft.     Tenderness: There is no abdominal tenderness.  Musculoskeletal:        General: No swelling.     Cervical back: Neck supple.     Comments: Mild tenderness to  medial aspect of left knee, no swelling, no redness.  No joint laxity on anterior posterior drawer test, no laxity on medial and lateral stress  Skin:    General: Skin is warm and dry.     Capillary Refill: Capillary refill takes less than 2 seconds.  Neurological:     General: No focal deficit present.     Mental Status: He is alert and oriented to person, place, and time.  Psychiatric:        Mood and Affect: Mood normal.     ED Results / Procedures / Treatments   Labs (all labs ordered are listed, but only abnormal results are displayed) Labs Reviewed - No data to display  EKG None  Radiology DG Knee Complete 4 Views Left  Result Date: 07/25/2023 CLINICAL DATA:  Status post trauma. EXAM: LEFT KNEE - COMPLETE 4+ VIEW COMPARISON:  None Available. FINDINGS: No evidence of fracture, dislocation, or joint effusion. No evidence of arthropathy or other focal bone abnormality. Soft tissues are unremarkable. IMPRESSION: Negative. Electronically Signed   By: Aram Candela M.D.   On: 07/25/2023 20:03    Procedures Procedures    Medications Ordered in  ED Medications  ibuprofen (ADVIL) tablet 600 mg (600 mg Oral Given 07/25/23 2214)    ED Course/ Medical Decision Making/ A&P                                 Medical Decision Making DDx: Fracture, sprain, dislocation, contusion, meniscus injury, other  Course: Patient here with left knee pain after feeling a pop in the medial aspect at work this morning around 5 AM.  No swelling, no redness, no tenderness, he is able to bear some weight and ambulate with assistance from crutches.  No ligamentous laxity, x-rays reviewed show no fracture or dislocation.  There is no large joint effusion noted.  Advises likely soft tissue injury, given immobilizer crutches advised on PCP/Ortho follow-up.  Amount and/or Complexity of Data Reviewed Radiology: ordered.  Risk Prescription drug management.           Final Clinical  Impression(s) / ED Diagnoses Final diagnoses:  Acute pain of left knee    Rx / DC Orders ED Discharge Orders          Ordered    naproxen (NAPROSYN) 500 MG tablet  2 times daily        07/25/23 2202              Josem Kaufmann 07/26/23 0011    Cathren Laine, MD 07/26/23 [phone]

## 2023-12-11 ENCOUNTER — Other Ambulatory Visit: Payer: Self-pay

## 2023-12-11 ENCOUNTER — Emergency Department (HOSPITAL_COMMUNITY)
Admission: EM | Admit: 2023-12-11 | Discharge: 2023-12-11 | Discharge status: Against Medical Advice | Payer: Self-pay | Attending: Emergency Medicine | Admitting: Emergency Medicine

## 2023-12-11 DIAGNOSIS — Z5321 Procedure and treatment not carried out due to patient leaving prior to being seen by health care provider: Secondary | ICD-10-CM | POA: Insufficient documentation

## 2023-12-11 DIAGNOSIS — R509 Fever, unspecified: Secondary | ICD-10-CM | POA: Insufficient documentation

## 2023-12-11 DIAGNOSIS — Z20822 Contact with and (suspected) exposure to covid-19: Secondary | ICD-10-CM | POA: Insufficient documentation

## 2023-12-11 DIAGNOSIS — M791 Myalgia, unspecified site: Secondary | ICD-10-CM | POA: Insufficient documentation

## 2023-12-11 LAB — RESP PANEL BY RT-PCR (RSV, FLU A&B, COVID)  RVPGX2
Influenza A by PCR: POSITIVE — AB
Influenza B by PCR: NEGATIVE
Resp Syncytial Virus by PCR: NEGATIVE
SARS Coronavirus 2 by RT PCR: NEGATIVE

## 2023-12-11 NOTE — ED Triage Notes (Signed)
Pt presents to ED with complaints of generalized body aches, chills, fever started last night.

## 2023-12-11 NOTE — ED Notes (Signed)
Called for Pt from waiting room X 3. No answer

## 2024-11-14 ENCOUNTER — Emergency Department (HOSPITAL_COMMUNITY)
Admission: EM | Admit: 2024-11-14 | Discharge: 2024-11-14 | Disposition: A | Discharge status: Home / Self Care | Payer: Self-pay | Attending: Emergency Medicine | Admitting: Emergency Medicine

## 2024-11-14 ENCOUNTER — Emergency Department (HOSPITAL_COMMUNITY): Payer: Self-pay

## 2024-11-14 DIAGNOSIS — X58XXXA Exposure to other specified factors, initial encounter: Secondary | ICD-10-CM | POA: Insufficient documentation

## 2024-11-14 DIAGNOSIS — S96912A Strain of unspecified muscle and tendon at ankle and foot level, left foot, initial encounter: Secondary | ICD-10-CM | POA: Insufficient documentation

## 2024-11-14 LAB — CBC WITH DIFFERENTIAL/PLATELET
Abs Immature Granulocytes: 0.02 K/uL (ref 0.00–0.07)
Basophils Absolute: 0 K/uL (ref 0.0–0.1)
Basophils Relative: 1 %
Eosinophils Absolute: 0.1 K/uL (ref 0.0–0.5)
Eosinophils Relative: 2 %
HCT: 46.5 % (ref 39.0–52.0)
Hemoglobin: 15.8 g/dL (ref 13.0–17.0)
Immature Granulocytes: 0 %
Lymphocytes Relative: 32 %
Lymphs Abs: 2.3 K/uL (ref 0.7–4.0)
MCH: 31 pg (ref 26.0–34.0)
MCHC: 34 g/dL (ref 30.0–36.0)
MCV: 91.4 fL (ref 80.0–100.0)
Monocytes Absolute: 0.7 K/uL (ref 0.1–1.0)
Monocytes Relative: 10 %
Neutro Abs: 3.9 K/uL (ref 1.7–7.7)
Neutrophils Relative %: 55 %
Platelets: 176 K/uL (ref 150–400)
RBC: 5.09 MIL/uL (ref 4.22–5.81)
RDW: 12.2 % (ref 11.5–15.5)
WBC: 7.1 K/uL (ref 4.0–10.5)
nRBC: 0 % (ref 0.0–0.2)

## 2024-11-14 LAB — BASIC METABOLIC PANEL WITH GFR
Anion gap: 13 (ref 5–15)
BUN: 14 mg/dL (ref 6–20)
CO2: 22 mmol/L (ref 22–32)
Calcium: 9.7 mg/dL (ref 8.9–10.3)
Chloride: 106 mmol/L (ref 98–111)
Creatinine, Ser: 0.99 mg/dL (ref 0.61–1.24)
GFR, Estimated: >60 mL/min
Glucose, Bld: 121 mg/dL — ABNORMAL HIGH (ref 70–99)
Potassium: 4.3 mmol/L (ref 3.5–5.1)
Sodium: 141 mmol/L (ref 135–145)

## 2024-11-14 LAB — D-DIMER, QUANTITATIVE: D-Dimer, Quant: <0.27 ug{FEU}/mL (ref 0.00–0.50)

## 2024-11-14 LAB — CK: Total CK: 242 U/L (ref 49–397)

## 2024-11-14 MED ORDER — PREDNISONE 50 MG PO TABS: 50.0000 mg | ORAL_TABLET | Freq: Every day | ORAL | 0 refills | Status: DC

## 2024-11-14 MED ORDER — KETOROLAC TROMETHAMINE 30 MG/ML IJ SOLN
30.0000 mg | Freq: Once | INTRAMUSCULAR | Status: AC
  Administered 2024-11-14: 30 mg via INTRAVENOUS
  Filled 2024-11-14: qty 1

## 2024-11-14 MED ORDER — MELOXICAM 15 MG PO TABS: 15.0000 mg | ORAL_TABLET | Freq: Every day | ORAL | 0 refills | Status: DC

## 2024-11-14 MED ORDER — PREDNISONE 50 MG PO TABS: 50.0000 mg | ORAL_TABLET | Freq: Every day | ORAL | 0 refills | Status: AC

## 2024-11-14 MED ORDER — MELOXICAM 15 MG PO TABS: 15.0000 mg | ORAL_TABLET | Freq: Every day | ORAL | 0 refills | Status: AC

## 2024-11-14 NOTE — ED Triage Notes (Signed)
 Pt c/o L ankle pain/lower tib/fib pain for 3 days. Pt denies known injury.

## 2024-11-14 NOTE — ED Provider Notes (Signed)
 " [place] EMERGENCY DEPARTMENT AT [facility] Provider Note   CSN: [id number] Arrival date & time: 11/14/24  9077     Patient presents with: Ankle Pain   Patrick Key is a 34 y.o. male.   Pt is a 34 yo male with no significant pmhx.  Pt c/o pain to his left lower leg for 3 days.  He denies any known trauma.  It hurts to put weight on it and to move it.  No f/c.  He does have a tattoo there, but it's over 49 years old.         Prior to Admission medications  Medication Sig Start Date End Date Taking? Authorizing Provider  lidocaine  (LIDODERM ) 5 % Place 1 patch onto the skin daily. Remove & Discard patch within 12 hours or as directed by MD 07/26/23   Suellen Cantor A, PA-C  meloxicam  (MOBIC ) 15 MG tablet Take 1 tablet (15 mg total) by mouth daily. 11/14/24   Dean Clarity, MD  naproxen  (NAPROSYN ) 500 MG tablet Take 1 tablet (500 mg total) by mouth 2 (two) times daily. 07/25/23   Suellen Cantor A, PA-C  ondansetron  (ZOFRAN  ODT) 4 MG disintegrating tablet Take 1 tablet (4 mg total) by mouth every 8 (eight) hours as needed for nausea or vomiting. 02/21/21   Roselyn Carlin NOVAK, MD  predniSONE  (DELTASONE ) 50 MG tablet Take 1 tablet (50 mg total) by mouth daily with breakfast. 11/14/24   Dean Clarity, MD    Allergies: Patient has no known allergies.    Review of Systems  Musculoskeletal:        Left lower leg pain  All other systems reviewed and are negative.   Updated Vital Signs BP (!) 140/70 (BP Location: Right Arm)   Pulse 83   Temp 98.7 F (37.1 C) (Oral)   Resp 18   Ht 5' 9 (1.753 m)   Wt 93 kg   SpO2 99%   BMI 30.27 kg/m   Physical Exam Vitals and nursing note reviewed.  Constitutional:      Appearance: Normal appearance.  HENT:     Head: Normocephalic and atraumatic.     Right Ear: External ear normal.     Left Ear: External ear normal.     Nose: Nose normal.     Mouth/Throat:     Mouth: Mucous membranes are moist.     Pharynx: Oropharynx  is clear.  Eyes:     Extraocular Movements: Extraocular movements intact.     Conjunctiva/sclera: Conjunctivae normal.     Pupils: Pupils are equal, round, and reactive to light.  Cardiovascular:     Rate and Rhythm: Normal rate and regular rhythm.     Pulses: Normal pulses.     Heart sounds: Normal heart sounds.  Pulmonary:     Effort: Pulmonary effort is normal.     Breath sounds: Normal breath sounds.  Abdominal:     General: Abdomen is flat. Bowel sounds are normal.     Palpations: Abdomen is soft.  Musculoskeletal:       Legs:     Comments: No redness swelling.  Pain with dorsiflexion.    Skin:    General: Skin is warm.     Capillary Refill: Capillary refill takes less than 2 seconds.  Neurological:     General: No focal deficit present.     Mental Status: He is alert and oriented to person, place, and time.  Psychiatric:  Mood and Affect: Mood normal.        Behavior: Behavior normal.     (all labs ordered are listed, but only abnormal results are displayed) Labs Reviewed  BASIC METABOLIC PANEL WITH GFR - Abnormal; Notable for the following components:      Result Value   Glucose, Bld 121 (*)    All other components within normal limits  CBC WITH DIFFERENTIAL/PLATELET  CK  D-DIMER, QUANTITATIVE    EKG: None  Radiology: DG Tibia/Fibula Left Result Date: 11/14/2024 CLINICAL DATA:  Left lower leg pain for 3 days without known injury EXAM: LEFT TIBIA AND FIBULA - 2 VIEW COMPARISON:  None Available. FINDINGS: There is no evidence of fracture or other focal bone lesions. Soft tissues are unremarkable. IMPRESSION: Negative. Electronically Signed   By: Lynwood Landy Raddle M.D.   On: 11/14/2024 10:02     Procedures   Medications Ordered in the ED  ketorolac  (TORADOL ) 30 MG/ML injection 30 mg (30 mg Intravenous Given 11/14/24 1007)                                    Medical Decision Making Amount and/or Complexity of Data Reviewed Labs: ordered. Radiology:  ordered.  Risk Prescription drug management.   This patient presents to the ED for concern of leg pain, this involves an extensive number of treatment options, and is a complaint that carries with it a high risk of complications and morbidity.  The differential diagnosis includes dvt, strain, fx, rhabdo   Co morbidities that complicate the patient evaluation  none   Additional history obtained:  Additional history obtained from epic chart review External records from outside source obtained and reviewed including wife   Lab Tests:  I Ordered, and personally interpreted labs.  The pertinent results include:  cbc nl, bmp nl, ddimer neg, ck nl   Imaging Studies ordered:  I ordered imaging studies including left tib/fib  I independently visualized and interpreted imaging which showed Negative.  I agree with the radiologist interpretation  Medicines ordered and prescription drug management:  I ordered medication including toradol   for sx  Reevaluation of the patient after these medicines showed that the patient improved I have reviewed the patients home medicines and have made adjustments as needed   Problem List / ED Course:  Anterior ankle strain:  pt placed in a boot and is instructed to f/u with ortho.  He is given a rx for mobic  and prednisone .  Return if worse.     Reevaluation:  After the interventions noted above, I reevaluated the patient and found that they have :improved   Social Determinants of Health:  Lives at home; self pay   Dispostion:  After consideration of the diagnostic results and the patients response to treatment, I feel that the patent would benefit from discharge with outpatient f/u.       Final diagnoses:  Ankle strain, left, initial encounter    ED Discharge Orders          Ordered    predniSONE  (DELTASONE ) 50 MG tablet  Daily with breakfast,   Status:  Discontinued        11/14/24 1231    meloxicam  (MOBIC ) 15 MG tablet   Daily,   Status:  Discontinued        11/14/24 1231    meloxicam  (MOBIC ) 15 MG tablet  Daily  11/14/24 1255    predniSONE  (DELTASONE ) 50 MG tablet  Daily with breakfast        11/14/24 1255               Dean Clarity, MD 11/14/24 1637  "
